# Patient Record
Sex: Male | Born: 1985 | Race: Black or African American | Hispanic: No | Marital: Single | Smoking: Current every day smoker
Health system: Southern US, Community
[De-identification: ages and names within clinical notes are randomized; demographics above are authoritative.]

---
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
## 2012-09-02 ENCOUNTER — Emergency Department: Payer: Self-pay | Admitting: Emergency Medicine

## 2014-05-27 ENCOUNTER — Other Ambulatory Visit: Payer: Self-pay

## 2014-05-27 ENCOUNTER — Encounter: Payer: Self-pay | Admitting: Emergency Medicine

## 2014-05-27 ENCOUNTER — Emergency Department
Admission: EM | Admit: 2014-05-27 | Discharge: 2014-05-27 | Disposition: A | Discharge status: Home / Self Care | Payer: Self-pay | Attending: Emergency Medicine | Admitting: Emergency Medicine

## 2014-05-27 ENCOUNTER — Emergency Department: Payer: Self-pay

## 2014-05-27 DIAGNOSIS — Z720 Tobacco use: Secondary | ICD-10-CM | POA: Insufficient documentation

## 2014-05-27 DIAGNOSIS — W228XXA Striking against or struck by other objects, initial encounter: Secondary | ICD-10-CM | POA: Insufficient documentation

## 2014-05-27 DIAGNOSIS — IMO0002 Reserved for concepts with insufficient information to code with codable children: Secondary | ICD-10-CM

## 2014-05-27 DIAGNOSIS — R55 Syncope and collapse: Secondary | ICD-10-CM | POA: Insufficient documentation

## 2014-05-27 DIAGNOSIS — Y998 Other external cause status: Secondary | ICD-10-CM | POA: Insufficient documentation

## 2014-05-27 DIAGNOSIS — S51811A Laceration without foreign body of right forearm, initial encounter: Secondary | ICD-10-CM | POA: Insufficient documentation

## 2014-05-27 DIAGNOSIS — W1839XA Other fall on same level, initial encounter: Secondary | ICD-10-CM | POA: Insufficient documentation

## 2014-05-27 DIAGNOSIS — Y9389 Activity, other specified: Secondary | ICD-10-CM | POA: Insufficient documentation

## 2014-05-27 DIAGNOSIS — S2232XA Fracture of one rib, left side, initial encounter for closed fracture: Secondary | ICD-10-CM | POA: Insufficient documentation

## 2014-05-27 DIAGNOSIS — Y92239 Unspecified place in hospital as the place of occurrence of the external cause: Secondary | ICD-10-CM | POA: Insufficient documentation

## 2014-05-27 MED ORDER — HYDROCODONE-ACETAMINOPHEN 5-325 MG PO TABS: 1.0000 | ORAL_TABLET | Freq: Four times a day (QID) | ORAL | Status: AC | PRN

## 2014-05-27 MED ORDER — IBUPROFEN 800 MG PO TABS
800.0000 mg | ORAL_TABLET | Freq: Once | ORAL | Status: AC
  Administered 2014-05-27: 800 mg via ORAL

## 2014-05-27 MED ORDER — HYDROCODONE-ACETAMINOPHEN 5-325 MG PO TABS
ORAL_TABLET | ORAL | Status: AC
  Administered 2014-05-27: 2 via ORAL
  Filled 2014-05-27: qty 1

## 2014-05-27 MED ORDER — HYDROCODONE-ACETAMINOPHEN 5-325 MG PO TABS
2.0000 | ORAL_TABLET | Freq: Once | ORAL | Status: AC
  Administered 2014-05-27: 2 via ORAL

## 2014-05-27 MED ORDER — IBUPROFEN 800 MG PO TABS
ORAL_TABLET | ORAL | Status: AC
  Administered 2014-05-27: 800 mg via ORAL
  Filled 2014-05-27: qty 1

## 2014-05-27 MED ORDER — IBUPROFEN 100 MG/5ML PO SUSP: 800.0000 mg | Freq: Once | ORAL | Status: DC

## 2014-05-27 MED ORDER — LIDOCAINE-EPINEPHRINE 2 %-1:100000 IJ SOLN: 5.0000 mL | Freq: Once | INTRAMUSCULAR | Status: DC

## 2014-05-27 MED ORDER — HYDROCODONE-ACETAMINOPHEN 5-325 MG PO TABS
ORAL_TABLET | ORAL | Status: AC
  Filled 2014-05-27: qty 1

## 2014-05-27 MED ORDER — IBUPROFEN 800 MG PO TABS: 800.0000 mg | ORAL_TABLET | Freq: Three times a day (TID) | ORAL | Status: AC | PRN

## 2014-05-27 MED ORDER — IBUPROFEN 800 MG PO TABS: 800.0000 mg | ORAL_TABLET | Freq: Once | ORAL | Status: DC

## 2014-05-27 MED ORDER — LIDOCAINE-EPINEPHRINE (PF) 1 %-1:200000 IJ SOLN
INTRAMUSCULAR | Status: AC
  Administered 2014-05-27: 10 mL via SUBCUTANEOUS
  Filled 2014-05-27: qty 30

## 2014-05-27 NOTE — ED Notes (Signed)
Patient to ED with c/o laceration to left forearm, cut with screen door.

## 2014-05-27 NOTE — ED Notes (Signed)
Report received that patient had syncopal episode in lobby prior to arm being wrapped.

## 2014-05-27 NOTE — ED Notes (Signed)
Rn walked into lobby and found pt laying on ground awake. Pt states "blood makes me whoozy." Pt in NAD. Pt assisted up to w/c, given cold drink. Resp even and unlabored.

## 2014-05-27 NOTE — ED Provider Notes (Signed)
CSN: [id number]     Arrival date & time 05/27/14  2048 History   None    Chief Complaint  Patient presents with  . Extremity Laceration     (Consider location/radiation/quality/duration/timing/severity/associated sxs/prior Treatment) HPI prior to the patient arrival he ran into a door cutting his forearm open up the laceration which initially came for evaluation for stated he had minimal pain with that no loss of sensation for range of motion through the extremity upon arrival to the department upon taking dressing off his wound had a vagal response upon seeing his blood passed out landing on his left chest wall and is now having left rib pain which she rates as about 8 out of 10 with any type of touch or big movement the reason no respiratory distress nothing seems to be making it particularly better or worse at this time no other associated signs or symptoms currently  No past medical history on file. No past surgical history on file. No family history on file. History  Substance Use Topics  . Smoking status: Current Every Day Smoker  . Smokeless tobacco: Never Used  . Alcohol Use: No    Review of Systems  Review of Systems Constitutional: No fever/chills Eyes: No visual changes. ENT: No sore throat. Cardiovascular: Denies chest pain. Respiratory: Denies shortness of breath. Gastrointestinal: No abdominal pain.  No nausea, no vomiting.  No diarrhea.  No constipation. Genitourinary: Negative for dysuria. Musculoskeletal: Negative for back pain. Skin: Negative for rash. Neurological: Negative for headaches, focal weakness or numbness.  10-point ROS otherwise negative.  Allergies  Review of patient's allergies indicates no known allergies.  Home Medications   Prior to Admission medications   Medication Sig Start Date End Date Taking? Authorizing Provider  HYDROcodone-acetaminophen (NORCO) 5-325 MG per tablet Take 1 tablet by mouth every 6 (six) hours as needed for moderate  pain or severe pain. 05/27/14   Dayan Desa William C Caide Campi, PA-C  ibuprofen (ADVIL,MOTRIN) 800 MG tablet Take 1 tablet (800 mg total) by mouth every 8 (eight) hours as needed. 05/27/14   Adlyn Fife William C Asherah Lavoy, PA-C   BP 101/61 mmHg  Pulse 52  Temp(Src) 98 F (36.7 C) (Oral)  Resp 20  Ht 5\' 11"  (1.803 m)  Wt 180 lb (81.647 kg)  BMI 25.12 kg/m2  SpO2 98% Physical Exam Exam on this patient reveals Naftin American male appearing stated age he is well-developed well-nourished and in no acute distress his vitals reviewed Head ears eyes nose neck and throat exams unremarkable Cardiovascular regular rate and rhythm no murmurs rubs gallops good distal pulses good cap refill Pulmonary lungs are clear to auscultation bilaterally Skin has a 4 cm laceration and multiple superficial abrasions to his right forearm  Musculoskeletal as pain with palpation to his left chest wall without palpable deformity or abnormality Neuro exam is nonfocal cranial nerves II through XII grossly intact  ED Course  Procedures (including critical care time) laceration repair on this patient patient had approximately 4 cm laceration to his right forearm linear into the subcutaneous tissue area was irrigated explored no foreign bodies identified numbed using 1% lidocaine with epinephrine prepped and in sterile fashion using Betadine irrigated and closed using 4 simple interrupted surgical sutures with good wound closure and good hemostasis patient tolerated the procedure well Labs Review Labs Reviewed - No data to display  Imaging Review Dg Ribs Unilateral W/chest Left  05/27/2014   CLINICAL DATA:  Syncope; fell onto floor, with left anterior rib pain. Initial  encounter.  EXAM: LEFT RIBS AND CHEST - 3+ VIEW  COMPARISON:  Chest radiograph performed 07/01/2006  FINDINGS: There is a displaced fracture of the left lateral seventh rib, with mild overlying pleural thickening, likely reflecting trace soft tissue hematoma.  The lungs are  well-aerated and clear. There is no evidence of focal opacification, pleural effusion or pneumothorax.  The cardiomediastinal silhouette is within normal limits. No additional osseous abnormalities are seen.  IMPRESSION: Displaced fracture of the left lateral seventh rib, with overlying pleural thickening, likely reflecting trace associated soft tissue hematoma. Lungs remain grossly clear.   Electronically Signed   By: Roanna RaiderJeffery  Chang M.D.   On: 05/27/2014 22:46   EKG showed patient's sinus bradycardia with a rate of 53 bpm   MDM  Patient laceration to his right forearm that was well closed using 4 simple interrupted sutures given wound care instructions patient does have a displaced left lateral seventh rib fracture patient was given Motrin and Norco for pain and is to follow-up with surgery for recheck in 5 days return here for any acute concerns or worsening symptoms patient did have a vagal response upon seeing his blood has been otherwise feeling well eating and drinking throughout the department and vitals have been stable Final diagnoses:  Laceration  Left rib fracture, closed, initial encounter        Tifini Reeder Rosalyn GessWilliam C Constant [place], PA-C 05/27/14 2330  Sharyn CreamerMark Quale, MD 05/27/14 [phone]

## 2014-12-09 IMAGING — CR DG HAND COMPLETE 3+V*L*
1 series · 3 of 3 positions shown · non-contrast
Comparison: none

REASON FOR EXAM: pain and swelling
COMMENTS:

PROCEDURE:     DXR - DXR HAND LT COMPLETE  W/OBLIQUES  - September 02, 2012  [DATE]
RESULT:     T and images show no evidence of fracture, dislocation or
radiopaque foreign body. The distal radius and ulna are partially skewered
by jewelry.

[Series 1: pa · 0.17mm/px · 3 of 3 slices shown]
[im 1/3]
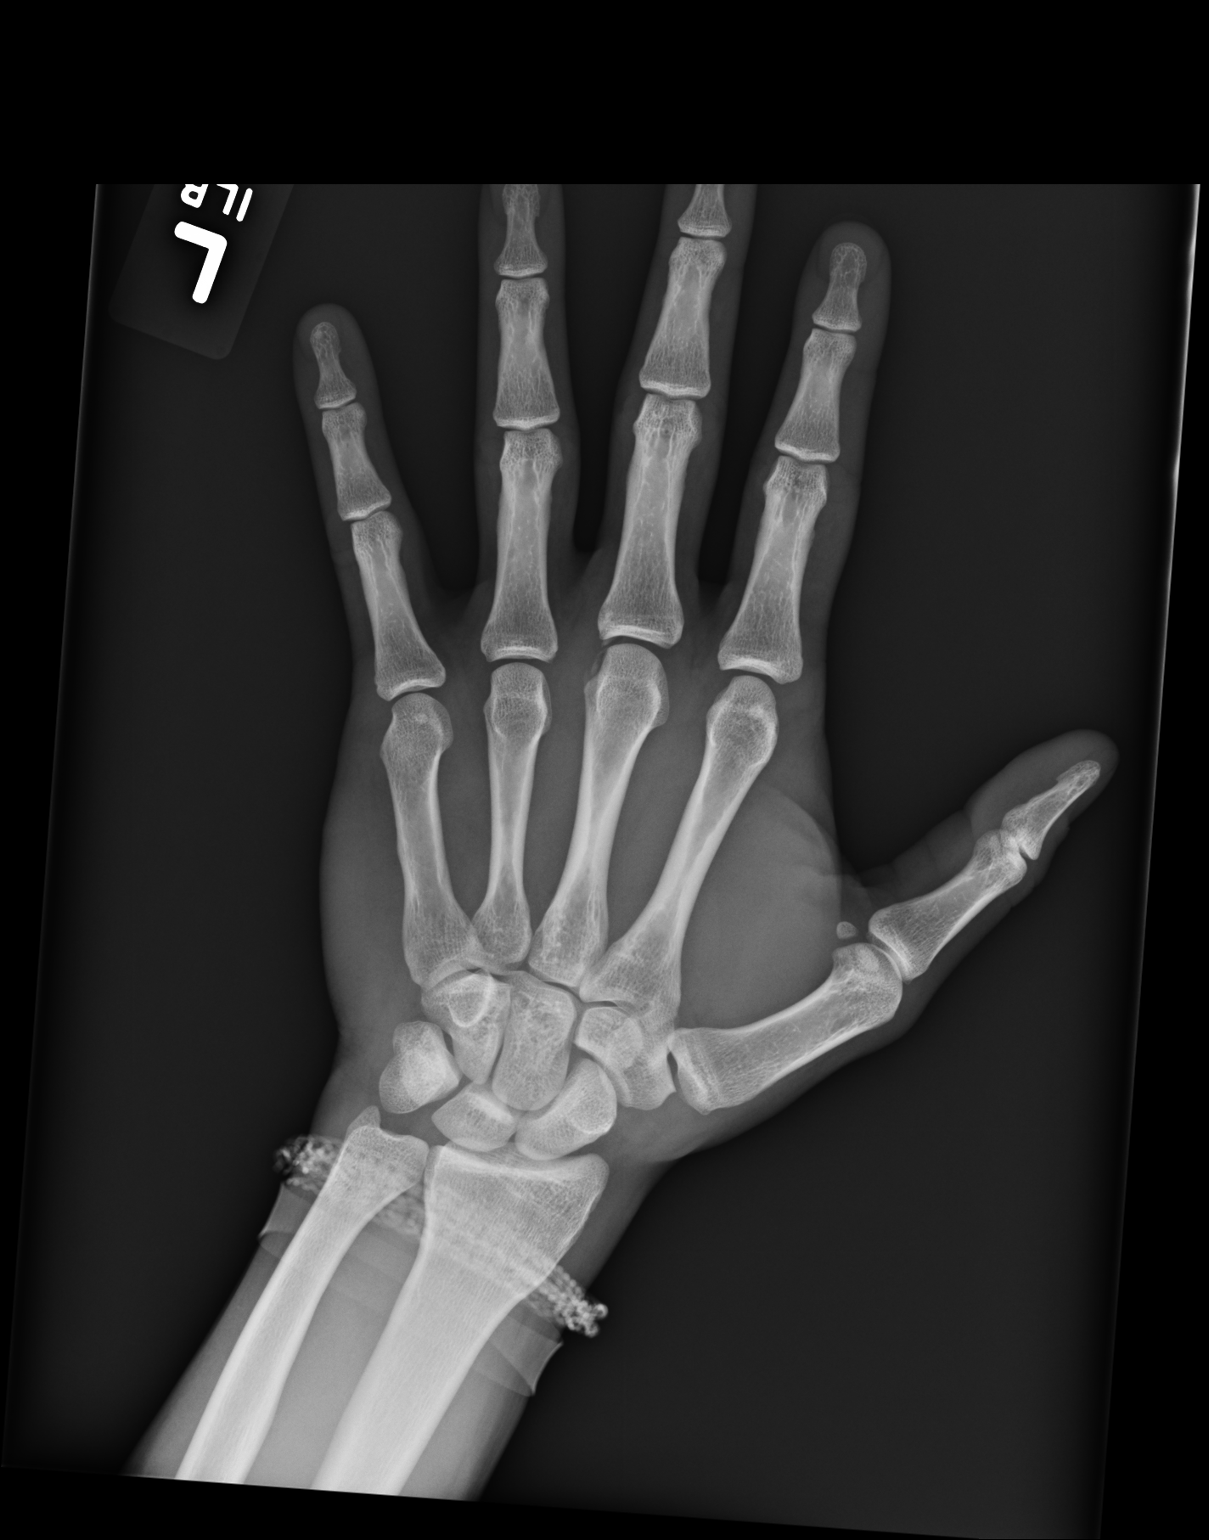
[im 2/3]
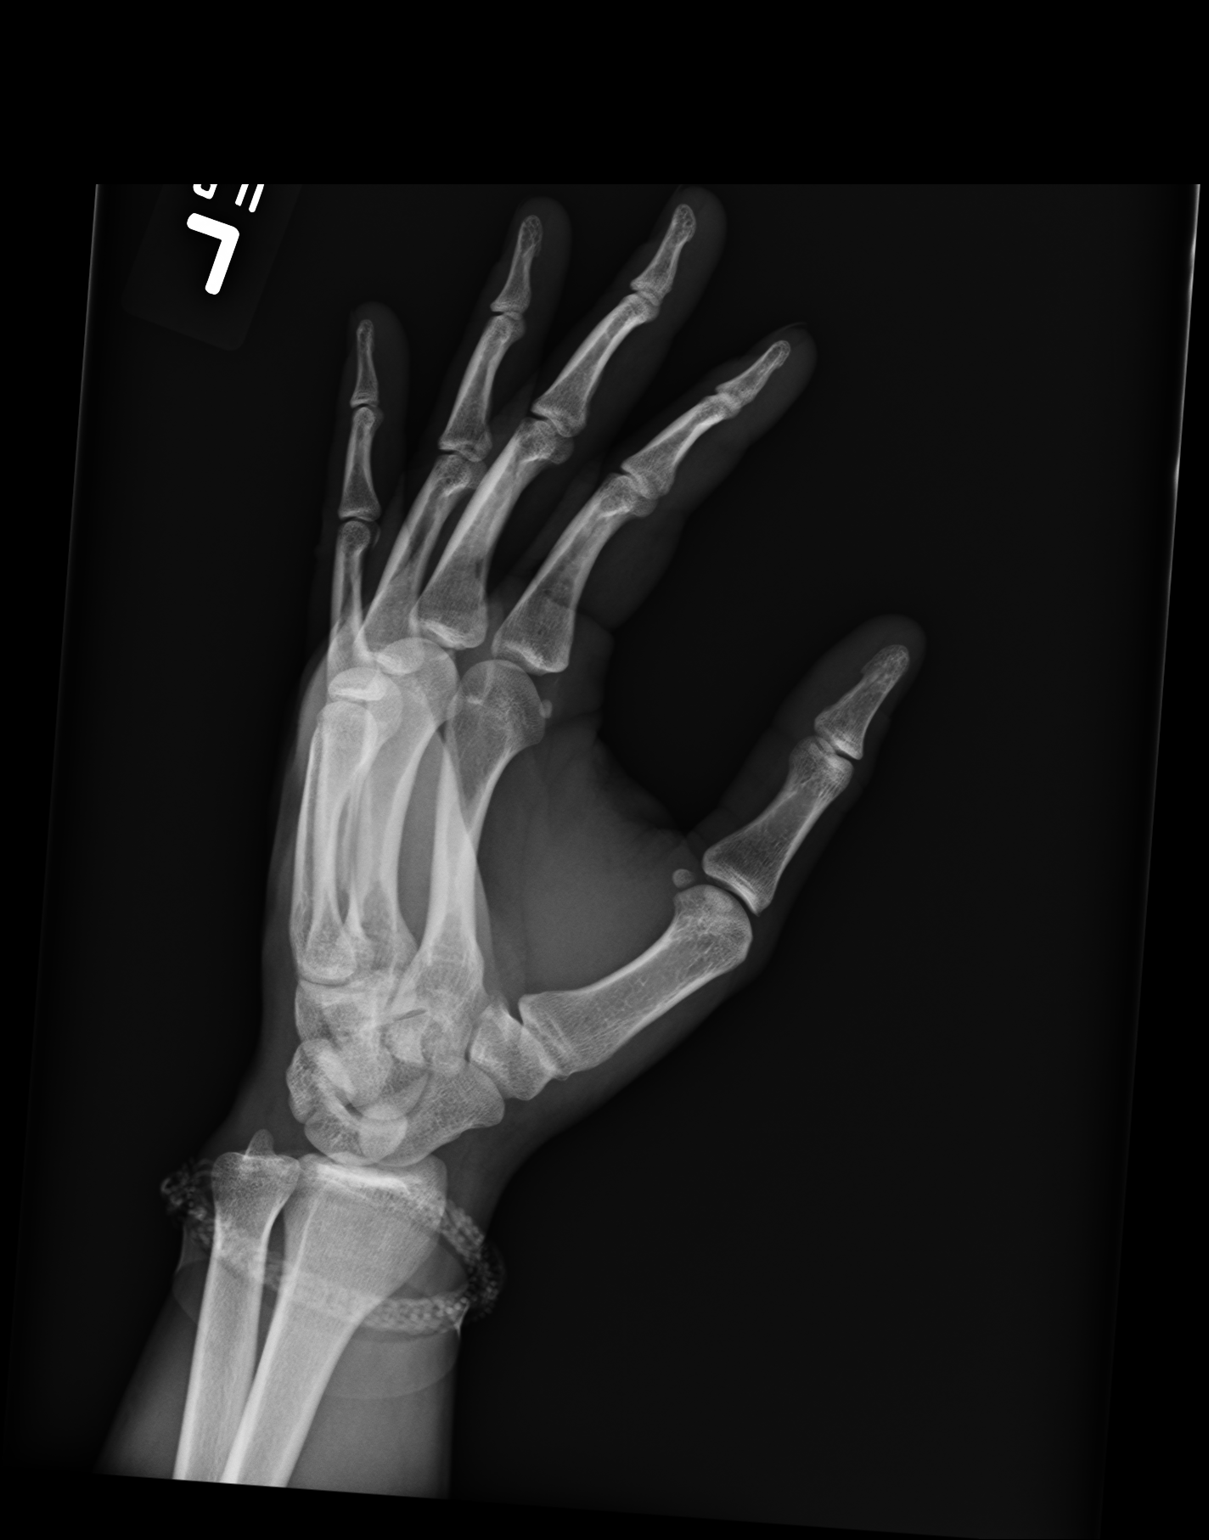
[im 3/3]
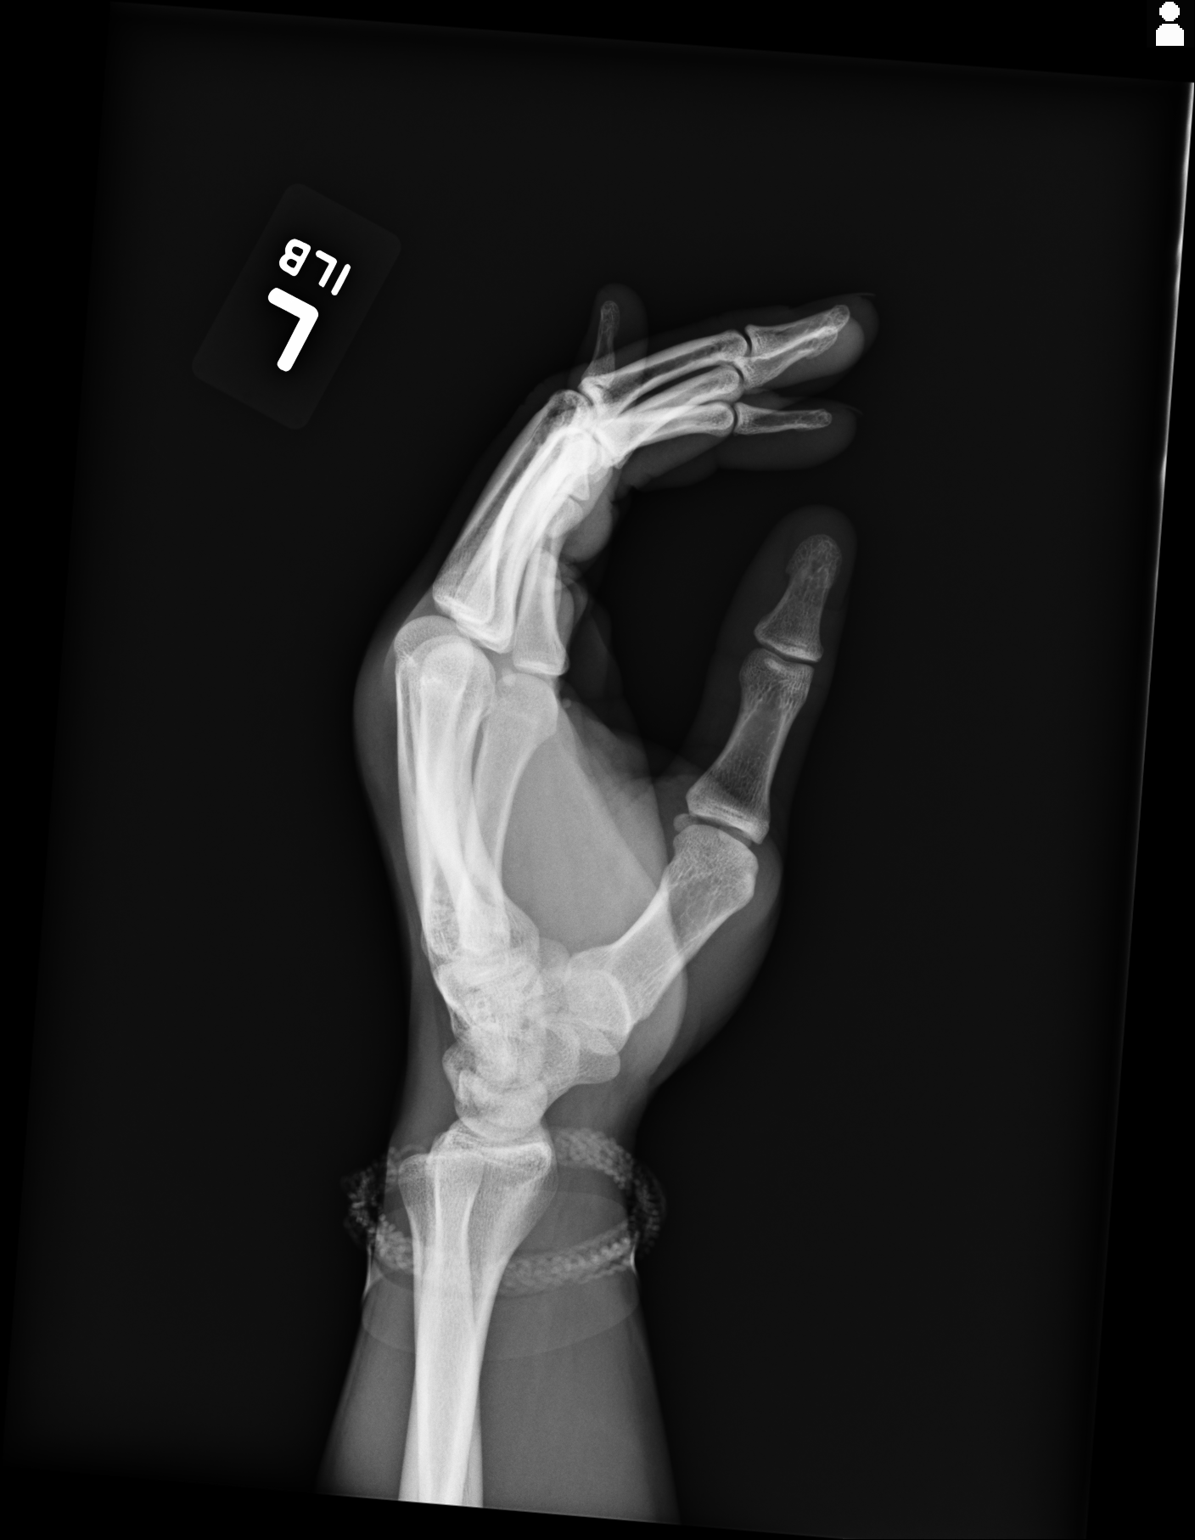

[3 of 3 positions shown; findings below may reference images not displayed]

IMPRESSION: Please see above.

[REDACTED]

## 2018-12-24 ENCOUNTER — Other Ambulatory Visit: Payer: Self-pay

## 2018-12-24 DIAGNOSIS — Z20822 Contact with and (suspected) exposure to covid-19: Secondary | ICD-10-CM

## 2018-12-25 LAB — NOVEL CORONAVIRUS, NAA: SARS-CoV-2, NAA: NOT DETECTED
